# Patient Record
Sex: Male | Born: 2000 | Race: White | Hispanic: No | Marital: Single
Health system: Southern US, Community
[De-identification: ages and names within clinical notes are randomized; demographics above are authoritative.]

---
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
## 2020-08-28 ENCOUNTER — Emergency Department (HOSPITAL_COMMUNITY): Payer: Medicaid Other

## 2020-08-28 ENCOUNTER — Other Ambulatory Visit: Payer: Self-pay

## 2020-08-28 ENCOUNTER — Encounter (HOSPITAL_COMMUNITY): Payer: Self-pay

## 2020-08-28 ENCOUNTER — Inpatient Hospital Stay (HOSPITAL_COMMUNITY)
Admission: EM | Admit: 2020-08-28 | Discharge: 2020-09-06 | DRG: 982 | Disposition: A | Discharge status: Home / Self Care | Payer: Medicaid Other | Attending: Physician Assistant | Admitting: Physician Assistant

## 2020-08-28 DIAGNOSIS — S22049A Unspecified fracture of fourth thoracic vertebra, initial encounter for closed fracture: Secondary | ICD-10-CM | POA: Diagnosis present

## 2020-08-28 DIAGNOSIS — I959 Hypotension, unspecified: Secondary | ICD-10-CM | POA: Diagnosis present

## 2020-08-28 DIAGNOSIS — Z6834 Body mass index (BMI) 34.0-34.9, adult: Secondary | ICD-10-CM

## 2020-08-28 DIAGNOSIS — S0003XA Contusion of scalp, initial encounter: Secondary | ICD-10-CM | POA: Diagnosis present

## 2020-08-28 DIAGNOSIS — R935 Abnormal findings on diagnostic imaging of other abdominal regions, including retroperitoneum: Secondary | ICD-10-CM | POA: Diagnosis present

## 2020-08-28 DIAGNOSIS — T1490XA Injury, unspecified, initial encounter: Secondary | ICD-10-CM

## 2020-08-28 DIAGNOSIS — S3600XA Unspecified injury of spleen, initial encounter: Secondary | ICD-10-CM | POA: Diagnosis present

## 2020-08-28 DIAGNOSIS — D72829 Elevated white blood cell count, unspecified: Secondary | ICD-10-CM | POA: Diagnosis present

## 2020-08-28 DIAGNOSIS — S3210XA Unspecified fracture of sacrum, initial encounter for closed fracture: Secondary | ICD-10-CM

## 2020-08-28 DIAGNOSIS — Z23 Encounter for immunization: Secondary | ICD-10-CM

## 2020-08-28 DIAGNOSIS — Z20822 Contact with and (suspected) exposure to covid-19: Secondary | ICD-10-CM | POA: Diagnosis present

## 2020-08-28 DIAGNOSIS — S31010A Laceration without foreign body of lower back and pelvis without penetration into retroperitoneum, initial encounter: Secondary | ICD-10-CM | POA: Diagnosis present

## 2020-08-28 DIAGNOSIS — K5900 Constipation, unspecified: Secondary | ICD-10-CM | POA: Diagnosis not present

## 2020-08-28 DIAGNOSIS — S32131A Minimally displaced Zone III fracture of sacrum, initial encounter for closed fracture: Secondary | ICD-10-CM

## 2020-08-28 DIAGNOSIS — S20229A Contusion of unspecified back wall of thorax, initial encounter: Secondary | ICD-10-CM | POA: Diagnosis present

## 2020-08-28 DIAGNOSIS — E669 Obesity, unspecified: Secondary | ICD-10-CM | POA: Diagnosis present

## 2020-08-28 DIAGNOSIS — S30810A Abrasion of lower back and pelvis, initial encounter: Secondary | ICD-10-CM | POA: Diagnosis present

## 2020-08-28 DIAGNOSIS — S3500XA Unspecified injury of abdominal aorta, initial encounter: Secondary | ICD-10-CM

## 2020-08-28 DIAGNOSIS — R9431 Abnormal electrocardiogram [ECG] [EKG]: Secondary | ICD-10-CM | POA: Diagnosis present

## 2020-08-28 DIAGNOSIS — J9690 Respiratory failure, unspecified, unspecified whether with hypoxia or hypercapnia: Secondary | ICD-10-CM

## 2020-08-28 DIAGNOSIS — Y92410 Unspecified street and highway as the place of occurrence of the external cause: Secondary | ICD-10-CM

## 2020-08-28 DIAGNOSIS — S27322A Contusion of lung, bilateral, initial encounter: Secondary | ICD-10-CM | POA: Diagnosis present

## 2020-08-28 DIAGNOSIS — T148XXA Other injury of unspecified body region, initial encounter: Secondary | ICD-10-CM

## 2020-08-28 DIAGNOSIS — E872 Acidosis: Secondary | ICD-10-CM | POA: Diagnosis present

## 2020-08-28 DIAGNOSIS — S50311A Abrasion of right elbow, initial encounter: Secondary | ICD-10-CM | POA: Diagnosis present

## 2020-08-28 DIAGNOSIS — S22009A Unspecified fracture of unspecified thoracic vertebra, initial encounter for closed fracture: Secondary | ICD-10-CM | POA: Diagnosis present

## 2020-08-28 DIAGNOSIS — S22039A Unspecified fracture of third thoracic vertebra, initial encounter for closed fracture: Secondary | ICD-10-CM | POA: Diagnosis present

## 2020-08-28 DIAGNOSIS — S2243XA Multiple fractures of ribs, bilateral, initial encounter for closed fracture: Secondary | ICD-10-CM | POA: Diagnosis present

## 2020-08-28 DIAGNOSIS — D6959 Other secondary thrombocytopenia: Secondary | ICD-10-CM | POA: Diagnosis present

## 2020-08-28 DIAGNOSIS — S2249XA Multiple fractures of ribs, unspecified side, initial encounter for closed fracture: Secondary | ICD-10-CM

## 2020-08-28 DIAGNOSIS — J939 Pneumothorax, unspecified: Secondary | ICD-10-CM

## 2020-08-28 DIAGNOSIS — D62 Acute posthemorrhagic anemia: Secondary | ICD-10-CM | POA: Diagnosis present

## 2020-08-28 DIAGNOSIS — S7001XA Contusion of right hip, initial encounter: Secondary | ICD-10-CM | POA: Diagnosis present

## 2020-08-28 DIAGNOSIS — S300XXA Contusion of lower back and pelvis, initial encounter: Principal | ICD-10-CM | POA: Diagnosis present

## 2020-08-28 LAB — CBC
HCT: 43.2 % (ref 39.0–52.0)
Hemoglobin: 14.3 g/dL (ref 13.0–17.0)
MCH: 28.9 pg (ref 26.0–34.0)
MCHC: 33.1 g/dL (ref 30.0–36.0)
MCV: 87.3 fL (ref 80.0–100.0)
Platelets: 280 10*3/uL (ref 150–400)
RBC: 4.95 MIL/uL (ref 4.22–5.81)
RDW: 13.1 % (ref 11.5–15.5)
WBC: 22.3 10*3/uL — ABNORMAL HIGH (ref 4.0–10.5)
nRBC: 0 % (ref 0.0–0.2)

## 2020-08-28 LAB — ETHANOL: Alcohol, Ethyl (B): <10 mg/dL (ref <10)

## 2020-08-28 LAB — I-STAT CHEM 8, ED
BUN: 9 mg/dL (ref 6–20)
Calcium, Ion: 1.1 mmol/L — ABNORMAL LOW (ref 1.15–1.40)
Chloride: 108 mmol/L (ref 98–111)
Creatinine, Ser: 1.2 mg/dL (ref 0.61–1.24)
Glucose, Bld: 150 mg/dL — ABNORMAL HIGH (ref 70–99)
HCT: 41 % (ref 39.0–52.0)
Hemoglobin: 13.9 g/dL (ref 13.0–17.0)
Potassium: 3.2 mmol/L — ABNORMAL LOW (ref 3.5–5.1)
Sodium: 141 mmol/L (ref 135–145)
TCO2: 17 mmol/L — ABNORMAL LOW (ref 22–32)

## 2020-08-28 LAB — COMPREHENSIVE METABOLIC PANEL WITH GFR
ALT: 42 U/L (ref 0–44)
AST: 64 U/L — ABNORMAL HIGH (ref 15–41)
Albumin: 3.9 g/dL (ref 3.5–5.0)
Alkaline Phosphatase: 61 U/L (ref 38–126)
Anion gap: 14 (ref 5–15)
BUN: 7 mg/dL (ref 6–20)
CO2: 19 mmol/L — ABNORMAL LOW (ref 22–32)
Calcium: 9.4 mg/dL (ref 8.9–10.3)
Chloride: 107 mmol/L (ref 98–111)
Creatinine, Ser: 1.28 mg/dL — ABNORMAL HIGH (ref 0.61–1.24)
GFR calc Af Amer: >60 mL/min (ref >60)
GFR calc non Af Amer: >60 mL/min (ref >60)
Glucose, Bld: 157 mg/dL — ABNORMAL HIGH (ref 70–99)
Potassium: 3.3 mmol/L — ABNORMAL LOW (ref 3.5–5.1)
Sodium: 140 mmol/L (ref 135–145)
Total Bilirubin: 0.5 mg/dL (ref 0.3–1.2)
Total Protein: 6.4 g/dL — ABNORMAL LOW (ref 6.5–8.1)

## 2020-08-28 LAB — SAMPLE TO BLOOD BANK

## 2020-08-28 LAB — SARS CORONAVIRUS 2 BY RT PCR (HOSPITAL ORDER, PERFORMED IN ~~LOC~~ HOSPITAL LAB): SARS Coronavirus 2: NEGATIVE

## 2020-08-28 LAB — PROTIME-INR
INR: 0.9 (ref 0.8–1.2)
Prothrombin Time: 11.7 s (ref 11.4–15.2)

## 2020-08-28 LAB — LACTIC ACID, PLASMA: Lactic Acid, Venous: 4.6 mmol/L (ref 0.5–1.9)

## 2020-08-28 MED ORDER — IOHEXOL 300 MG/ML  SOLN
100.0000 mL | Freq: Once | INTRAMUSCULAR | Status: AC | PRN
  Administered 2020-08-28: 100 mL via INTRAVENOUS

## 2020-08-28 MED ORDER — FENTANYL CITRATE (PF) 100 MCG/2ML IJ SOLN
100.0000 ug | Freq: Once | INTRAMUSCULAR | Status: AC
  Administered 2020-08-28: 100 ug via INTRAVENOUS

## 2020-08-28 MED ORDER — FENTANYL CITRATE (PF) 100 MCG/2ML IJ SOLN
INTRAMUSCULAR | Status: AC
  Filled 2020-08-28: qty 2

## 2020-08-28 MED ORDER — CEFAZOLIN SODIUM-DEXTROSE 2-4 GM/100ML-% IV SOLN
2.0000 g | INTRAVENOUS | Status: AC
  Administered 2020-08-29: 2 g via INTRAVENOUS
  Filled 2020-08-28: qty 100

## 2020-08-28 MED ORDER — TETANUS-DIPHTH-ACELL PERTUSSIS 5-2.5-18.5 LF-MCG/0.5 IM SUSP
0.5000 mL | Freq: Once | INTRAMUSCULAR | Status: AC
  Administered 2020-08-28: 0.5 mL via INTRAMUSCULAR
  Filled 2020-08-28: qty 0.5

## 2020-08-28 MED ORDER — LACTATED RINGERS IV BOLUS
1000.0000 mL | Freq: Once | INTRAVENOUS | Status: AC
  Administered 2020-08-28: 1000 mL via INTRAVENOUS
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
## 2020-08-28 NOTE — ED Notes (Signed)
Pelvic binder applied 
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
## 2020-08-29 ENCOUNTER — Inpatient Hospital Stay (HOSPITAL_COMMUNITY): Payer: Medicaid Other

## 2020-08-29 ENCOUNTER — Encounter (HOSPITAL_COMMUNITY): Payer: Self-pay

## 2020-08-29 DIAGNOSIS — S27322A Contusion of lung, bilateral, initial encounter: Secondary | ICD-10-CM | POA: Diagnosis not present

## 2020-08-29 DIAGNOSIS — E872 Acidosis: Secondary | ICD-10-CM | POA: Diagnosis not present

## 2020-08-29 DIAGNOSIS — S20229A Contusion of unspecified back wall of thorax, initial encounter: Secondary | ICD-10-CM | POA: Diagnosis present

## 2020-08-29 DIAGNOSIS — D6959 Other secondary thrombocytopenia: Secondary | ICD-10-CM | POA: Diagnosis present

## 2020-08-29 DIAGNOSIS — R9431 Abnormal electrocardiogram [ECG] [EKG]: Secondary | ICD-10-CM | POA: Diagnosis present

## 2020-08-29 DIAGNOSIS — S30810A Abrasion of lower back and pelvis, initial encounter: Secondary | ICD-10-CM | POA: Diagnosis present

## 2020-08-29 DIAGNOSIS — D62 Acute posthemorrhagic anemia: Secondary | ICD-10-CM | POA: Diagnosis not present

## 2020-08-29 DIAGNOSIS — S3509XA Other injury of abdominal aorta, initial encounter: Secondary | ICD-10-CM

## 2020-08-29 DIAGNOSIS — S3600XA Unspecified injury of spleen, initial encounter: Secondary | ICD-10-CM | POA: Diagnosis not present

## 2020-08-29 DIAGNOSIS — S22039A Unspecified fracture of third thoracic vertebra, initial encounter for closed fracture: Secondary | ICD-10-CM | POA: Diagnosis present

## 2020-08-29 DIAGNOSIS — E669 Obesity, unspecified: Secondary | ICD-10-CM | POA: Diagnosis present

## 2020-08-29 DIAGNOSIS — S2243XA Multiple fractures of ribs, bilateral, initial encounter for closed fracture: Secondary | ICD-10-CM | POA: Diagnosis not present

## 2020-08-29 DIAGNOSIS — Z6834 Body mass index (BMI) 34.0-34.9, adult: Secondary | ICD-10-CM | POA: Diagnosis not present

## 2020-08-29 DIAGNOSIS — S22009A Unspecified fracture of unspecified thoracic vertebra, initial encounter for closed fracture: Secondary | ICD-10-CM | POA: Diagnosis present

## 2020-08-29 DIAGNOSIS — S32131A Minimally displaced Zone III fracture of sacrum, initial encounter for closed fracture: Secondary | ICD-10-CM | POA: Diagnosis not present

## 2020-08-29 DIAGNOSIS — S31010A Laceration without foreign body of lower back and pelvis without penetration into retroperitoneum, initial encounter: Secondary | ICD-10-CM | POA: Diagnosis not present

## 2020-08-29 DIAGNOSIS — Z20822 Contact with and (suspected) exposure to covid-19: Secondary | ICD-10-CM | POA: Diagnosis not present

## 2020-08-29 DIAGNOSIS — I959 Hypotension, unspecified: Secondary | ICD-10-CM | POA: Diagnosis present

## 2020-08-29 DIAGNOSIS — D72829 Elevated white blood cell count, unspecified: Secondary | ICD-10-CM | POA: Diagnosis present

## 2020-08-29 DIAGNOSIS — K5900 Constipation, unspecified: Secondary | ICD-10-CM | POA: Diagnosis not present

## 2020-08-29 DIAGNOSIS — S50311A Abrasion of right elbow, initial encounter: Secondary | ICD-10-CM | POA: Diagnosis present

## 2020-08-29 DIAGNOSIS — S300XXA Contusion of lower back and pelvis, initial encounter: Secondary | ICD-10-CM | POA: Diagnosis present

## 2020-08-29 DIAGNOSIS — Y92410 Unspecified street and highway as the place of occurrence of the external cause: Secondary | ICD-10-CM | POA: Diagnosis not present

## 2020-08-29 DIAGNOSIS — S7001XA Contusion of right hip, initial encounter: Secondary | ICD-10-CM | POA: Diagnosis present

## 2020-08-29 DIAGNOSIS — S0003XA Contusion of scalp, initial encounter: Secondary | ICD-10-CM | POA: Diagnosis present

## 2020-08-29 DIAGNOSIS — Z23 Encounter for immunization: Secondary | ICD-10-CM | POA: Diagnosis not present

## 2020-08-29 DIAGNOSIS — S22049A Unspecified fracture of fourth thoracic vertebra, initial encounter for closed fracture: Secondary | ICD-10-CM | POA: Diagnosis not present

## 2020-08-29 LAB — MRSA PCR SCREENING: MRSA by PCR: NEGATIVE

## 2020-08-29 MED ORDER — ACETAMINOPHEN 500 MG PO TABS
1000.0000 mg | ORAL_TABLET | Freq: Four times a day (QID) | ORAL | Status: DC
  Administered 2020-08-29 – 2020-09-06 (×30): 1000 mg via ORAL
  Filled 2020-08-29 (×30): qty 2

## 2020-08-29 MED ORDER — HYDROMORPHONE HCL 1 MG/ML IJ SOLN
1.0000 mg | INTRAMUSCULAR | Status: DC | PRN
  Administered 2020-08-29: 1 mg via INTRAVENOUS
  Filled 2020-08-29: qty 1

## 2020-08-29 MED ORDER — ENOXAPARIN SODIUM 40 MG/0.4ML ~~LOC~~ SOLN
40.0000 mg | Freq: Two times a day (BID) | SUBCUTANEOUS | Status: DC
  Administered 2020-08-29 (×2): 40 mg via SUBCUTANEOUS
  Filled 2020-08-29 (×2): qty 0.4

## 2020-08-29 MED ORDER — ENOXAPARIN SODIUM 40 MG/0.4ML ~~LOC~~ SOLN: 40.0000 mg | SUBCUTANEOUS | Status: DC

## 2020-08-29 MED ORDER — POTASSIUM CHLORIDE IN NACL 20-0.9 MEQ/L-% IV SOLN
INTRAVENOUS | Status: DC
  Filled 2020-08-29: qty 1000

## 2020-08-29 MED ORDER — LACTATED RINGERS IV SOLN: INTRAVENOUS | Status: DC

## 2020-08-29 MED ORDER — POTASSIUM CHLORIDE 20 MEQ/15ML (10%) PO SOLN
40.0000 meq | ORAL | Status: AC
  Administered 2020-08-29: 40 meq via ORAL
  Filled 2020-08-29: qty 30

## 2020-08-29 MED ORDER — HYDROMORPHONE HCL 1 MG/ML IJ SOLN
2.0000 mg | Freq: Once | INTRAMUSCULAR | Status: AC
  Administered 2020-08-29: 2 mg via INTRAVENOUS
  Filled 2020-08-29: qty 2

## 2020-08-29 MED ORDER — METHOCARBAMOL 500 MG PO TABS
1000.0000 mg | ORAL_TABLET | Freq: Three times a day (TID) | ORAL | Status: DC
  Administered 2020-08-30 – 2020-09-06 (×22): 1000 mg via ORAL
  Filled 2020-08-29 (×22): qty 2

## 2020-08-29 MED ORDER — HYDROMORPHONE HCL 1 MG/ML IJ SOLN
0.5000 mg | INTRAMUSCULAR | Status: DC | PRN
  Administered 2020-08-29 – 2020-08-31 (×4): 0.5 mg via INTRAVENOUS
  Filled 2020-08-29 (×4): qty 1

## 2020-08-29 MED ORDER — ONDANSETRON HCL 4 MG/2ML IJ SOLN
4.0000 mg | Freq: Four times a day (QID) | INTRAMUSCULAR | Status: DC | PRN
  Administered 2020-08-29 – 2020-09-01 (×2): 4 mg via INTRAVENOUS
  Filled 2020-08-29 (×2): qty 2

## 2020-08-29 MED ORDER — LACTATED RINGERS IV BOLUS
1000.0000 mL | Freq: Once | INTRAVENOUS | Status: AC
  Administered 2020-08-29: 1000 mL via INTRAVENOUS

## 2020-08-29 MED ORDER — ONDANSETRON HCL 4 MG/2ML IJ SOLN
4.0000 mg | Freq: Once | INTRAMUSCULAR | Status: AC
  Administered 2020-08-29: 4 mg via INTRAVENOUS
  Filled 2020-08-29: qty 2

## 2020-08-29 MED ORDER — OXYCODONE HCL 5 MG/5ML PO SOLN: 5.0000 mg | ORAL | Status: DC | PRN

## 2020-08-29 MED ORDER — METOCLOPRAMIDE HCL 5 MG/ML IJ SOLN
10.0000 mg | INTRAMUSCULAR | Status: DC
  Filled 2020-08-29: qty 2

## 2020-08-29 MED ORDER — ONDANSETRON 4 MG PO TBDP: 4.0000 mg | ORAL_TABLET | Freq: Four times a day (QID) | ORAL | Status: DC | PRN

## 2020-08-29 MED ORDER — CHLORHEXIDINE GLUCONATE CLOTH 2 % EX PADS
6.0000 | MEDICATED_PAD | Freq: Every day | CUTANEOUS | Status: DC
  Administered 2020-08-29 – 2020-09-01 (×3): 6 via TOPICAL

## 2020-08-29 MED ORDER — PROMETHAZINE HCL 25 MG/ML IJ SOLN
25.0000 mg | Freq: Four times a day (QID) | INTRAMUSCULAR | Status: DC | PRN
  Administered 2020-08-29 – 2020-08-30 (×4): 25 mg via INTRAVENOUS
  Filled 2020-08-29 (×4): qty 1
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
## 2020-08-29 NOTE — Progress Notes (Signed)
Patient received on unit; while turning patient to right side to change sheets copious sanguineous drainage was observed to pour out of a small wound in patient's right lower back. Soft tissue was also seen coming out of the wound. Patient was in great pain when turned to the right. RN administered pain medicine, held pressure and applied pressure dressing. Physician notified, patient stable at this time. Will continue to monitor
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
## 2020-08-29 NOTE — Progress Notes (Signed)
CT c-spine reviewed and negative for fracture. Clinical exam performed to evaluate for ligamentous injury. C-spine evaluation performed. No midline pain or TTP. Patient able to turn head left and right without midline cervical pain. Neck flexion and extension performed without midline pain. C-spine cleared and collar removed.   Kevin Palleschi N. Valentine Barney, MD General and Trauma Surgery [facility]   
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
## 2020-08-30 ENCOUNTER — Inpatient Hospital Stay (HOSPITAL_COMMUNITY): Payer: Medicaid Other

## 2020-08-30 LAB — CBC
HCT: 27.2 % — ABNORMAL LOW (ref 39.0–52.0)
HCT: 26.2 % — ABNORMAL LOW (ref 39.0–52.0)
HCT: 25.2 % — ABNORMAL LOW (ref 39.0–52.0)
Hemoglobin: 9.1 g/dL — ABNORMAL LOW (ref 13.0–17.0)
Hemoglobin: 8.6 g/dL — ABNORMAL LOW (ref 13.0–17.0)
Hemoglobin: 8.5 g/dL — ABNORMAL LOW (ref 13.0–17.0)
MCH: 28.7 pg (ref 26.0–34.0)
MCH: 28 pg (ref 26.0–34.0)
MCH: 28.7 pg (ref 26.0–34.0)
MCHC: 33.5 g/dL (ref 30.0–36.0)
MCHC: 32.8 g/dL (ref 30.0–36.0)
MCHC: 33.7 g/dL (ref 30.0–36.0)
MCV: 85.8 fL (ref 80.0–100.0)
MCV: 85.3 fL (ref 80.0–100.0)
MCV: 85.1 fL (ref 80.0–100.0)
Platelets: 58 10*3/uL — ABNORMAL LOW (ref 150–400)
Platelets: 51 10*3/uL — ABNORMAL LOW (ref 150–400)
Platelets: 52 10*3/uL — ABNORMAL LOW (ref 150–400)
RBC: 3.17 MIL/uL — ABNORMAL LOW (ref 4.22–5.81)
RBC: 3.07 MIL/uL — ABNORMAL LOW (ref 4.22–5.81)
RBC: 2.96 MIL/uL — ABNORMAL LOW (ref 4.22–5.81)
RDW: 13.4 % (ref 11.5–15.5)
RDW: 13.4 % (ref 11.5–15.5)
RDW: 13.3 % (ref 11.5–15.5)
WBC: 7.2 10*3/uL (ref 4.0–10.5)
WBC: 7 10*3/uL (ref 4.0–10.5)
WBC: 7.7 10*3/uL (ref 4.0–10.5)
nRBC: 0 % (ref 0.0–0.2)
nRBC: 0 % (ref 0.0–0.2)
nRBC: 0 % (ref 0.0–0.2)

## 2020-08-30 LAB — BASIC METABOLIC PANEL WITH GFR
Anion gap: 8 (ref 5–15)
BUN: 11 mg/dL (ref 6–20)
CO2: 24 mmol/L (ref 22–32)
Calcium: 8.4 mg/dL — ABNORMAL LOW (ref 8.9–10.3)
Chloride: 105 mmol/L (ref 98–111)
Creatinine, Ser: 0.99 mg/dL (ref 0.61–1.24)
GFR calc Af Amer: >60 mL/min (ref >60)
GFR calc non Af Amer: >60 mL/min (ref >60)
Glucose, Bld: 105 mg/dL — ABNORMAL HIGH (ref 70–99)
Potassium: 3.9 mmol/L (ref 3.5–5.1)
Sodium: 137 mmol/L (ref 135–145)

## 2020-08-30 LAB — HIV ANTIBODY (ROUTINE TESTING W REFLEX): HIV Screen 4th Generation wRfx: NONREACTIVE

## 2020-08-30 MED ORDER — BACITRACIN ZINC 500 UNIT/GM EX OINT
TOPICAL_OINTMENT | Freq: Two times a day (BID) | CUTANEOUS | Status: DC
  Administered 2020-09-01 – 2020-09-06 (×3): 1 via TOPICAL
  Filled 2020-08-30: qty 28.4

## 2020-08-30 MED ORDER — IOHEXOL 350 MG/ML SOLN
100.0000 mL | Freq: Once | INTRAVENOUS | Status: AC | PRN
  Administered 2020-08-30: 100 mL via INTRAVENOUS
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
## 2020-08-30 NOTE — Progress Notes (Addendum)
Patient ID: Kevin Galvan, male   DOB: 20-Jun-2001, 19 y.o.   MRN: [id number] Follow-up CT angio chest abdomen pelvis preliminarily reviewed with radiologist.  The aortic injury looks resolved and nonconcerning.  Significant hematoma of right buttock area extending from sacral fracture without active extravasation.  His soft tissue William Hamburger  type injury of the back is extensive and has significantly more air within it today.  This extends from his back including a small area of his retroperitoneum around and down into a portion of his scrotum and up around his ribs on the right side.  I do not see a significant right-sided pneumothorax.  No sign of bowel perforation. His hemoglobin has stabilized and his white blood cell count is normal.  He is afebrile.  On examination he has no significant tenderness in his scrotal area or perineal area.  No evidence of infection there.  His back has a large abrasion and hematoma more focused on the right buttock area and extending up his back.  There is a small open wound that is draining some serosanguineous fluid.  No cellulitis, no erythema.  Plan for further review by the trauma radiologist.  I discussed the plan in detail with the patient.  Violeta Gelinas, MD, MPH, FACS Please use [email or web address] to contact on call provider
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
## 2020-08-31 LAB — CBC
HCT: 22.3 % — ABNORMAL LOW (ref 39.0–52.0)
Hemoglobin: 7.3 g/dL — ABNORMAL LOW (ref 13.0–17.0)
MCH: 27.9 pg (ref 26.0–34.0)
MCHC: 32.7 g/dL (ref 30.0–36.0)
MCV: 85.1 fL (ref 80.0–100.0)
Platelets: 53 10*3/uL — ABNORMAL LOW (ref 150–400)
RBC: 2.62 MIL/uL — ABNORMAL LOW (ref 4.22–5.81)
RDW: 13.3 % (ref 11.5–15.5)
WBC: 6.7 10*3/uL (ref 4.0–10.5)
nRBC: 0 % (ref 0.0–0.2)

## 2020-08-31 LAB — BASIC METABOLIC PANEL WITH GFR
Anion gap: 8 (ref 5–15)
BUN: 10 mg/dL (ref 6–20)
CO2: 24 mmol/L (ref 22–32)
Calcium: 8.4 mg/dL — ABNORMAL LOW (ref 8.9–10.3)
Chloride: 105 mmol/L (ref 98–111)
Creatinine, Ser: 0.83 mg/dL (ref 0.61–1.24)
GFR calc Af Amer: >60 mL/min (ref >60)
GFR calc non Af Amer: >60 mL/min (ref >60)
Glucose, Bld: 96 mg/dL (ref 70–99)
Potassium: 3.8 mmol/L (ref 3.5–5.1)
Sodium: 137 mmol/L (ref 135–145)

## 2020-08-31 MED ORDER — GABAPENTIN 300 MG PO CAPS
300.0000 mg | ORAL_CAPSULE | Freq: Three times a day (TID) | ORAL | Status: DC
  Administered 2020-08-31 – 2020-09-06 (×19): 300 mg via ORAL
  Filled 2020-08-31 (×19): qty 1

## 2020-08-31 MED ORDER — OXYCODONE HCL 5 MG/5ML PO SOLN
5.0000 mg | ORAL | Status: DC | PRN
  Administered 2020-08-31 – 2020-09-05 (×6): 10 mg via ORAL
  Filled 2020-08-31 (×6): qty 10

## 2020-08-31 MED ORDER — CEFAZOLIN SODIUM-DEXTROSE 2-4 GM/100ML-% IV SOLN
2.0000 g | INTRAVENOUS | Status: AC
  Administered 2020-09-01: 2 g via INTRAVENOUS
  Filled 2020-08-31: qty 100

## 2020-08-31 MED ORDER — ENSURE PRE-SURGERY PO LIQD
296.0000 mL | Freq: Once | ORAL | Status: AC
  Administered 2020-08-31: 296 mL via ORAL
  Filled 2020-08-31: qty 296

## 2020-08-31 MED ORDER — POVIDONE-IODINE 10 % EX SWAB: 2.0000 "application " | Freq: Once | CUTANEOUS | Status: DC

## 2020-08-31 MED ORDER — CHLORHEXIDINE GLUCONATE 4 % EX LIQD
60.0000 mL | Freq: Once | CUTANEOUS | Status: AC
  Administered 2020-08-31: 4 via TOPICAL
  Filled 2020-08-31: qty 15

## 2020-08-31 MED ORDER — HYDROMORPHONE HCL 1 MG/ML IJ SOLN: 0.5000 mg | INTRAMUSCULAR | Status: DC | PRN
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
## 2020-08-31 NOTE — Consult Note (Signed)
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 patient's extensive soft tissue injuries. Explained the concern with this type of injury is possibility of infection in the future. If we were to place drain(s) for these lesions, patient is looking at a minimum of 4-6 drains (again as his injury is extensive)- it is unlikely that drains will help this condition. Per Dr. Loreta Ave, does not recommend drain placement(s) and recommends surgical intervention/washout as management. Patient appears to be leaning towards surgery but asks that he speak with his parents and orthopedics again before making a final decision. Dr. Loreta Ave to  discuss above with Charma Igo, PA-C via telephone. No IR interventions planned at this time- will delete order. Please call IR with questions/concerns.   Thank you for this interesting consult.  I greatly enjoyed meeting Kevin Galvan and look forward to participating in their care.  A copy of this report was sent to the requesting provider on this date.  Electronically Signed: Elwin Mocha, PA-C 08/31/2020, 2:37 PM   I spent a total of 20 Minutes in face to face in clinical consultation, greater than 50% of which was counseling/coordinating care for lumbosacral Morel-Lavallee lesions.
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
## 2020-09-01 ENCOUNTER — Inpatient Hospital Stay (HOSPITAL_COMMUNITY): Payer: Medicaid Other | Admitting: Anesthesiology

## 2020-09-01 ENCOUNTER — Encounter (HOSPITAL_COMMUNITY): Admission: EM | Disposition: A | Discharge status: Home / Self Care | Payer: Self-pay | Source: Home / Self Care

## 2020-09-01 ENCOUNTER — Encounter (HOSPITAL_COMMUNITY): Payer: Self-pay

## 2020-09-01 DIAGNOSIS — S2249XA Multiple fractures of ribs, unspecified side, initial encounter for closed fracture: Secondary | ICD-10-CM

## 2020-09-01 DIAGNOSIS — S3210XA Unspecified fracture of sacrum, initial encounter for closed fracture: Secondary | ICD-10-CM

## 2020-09-01 DIAGNOSIS — T148XXA Other injury of unspecified body region, initial encounter: Secondary | ICD-10-CM

## 2020-09-01 HISTORY — PX: INCISION AND DRAINAGE HIP: SHX1801

## 2020-09-01 LAB — BASIC METABOLIC PANEL WITH GFR
Anion gap: 10 (ref 5–15)
BUN: 10 mg/dL (ref 6–20)
CO2: 24 mmol/L (ref 22–32)
Calcium: 8.5 mg/dL — ABNORMAL LOW (ref 8.9–10.3)
Chloride: 105 mmol/L (ref 98–111)
Creatinine, Ser: 0.88 mg/dL (ref 0.61–1.24)
GFR calc Af Amer: >60 mL/min (ref >60)
GFR calc non Af Amer: >60 mL/min (ref >60)
Glucose, Bld: 100 mg/dL — ABNORMAL HIGH (ref 70–99)
Potassium: 4 mmol/L (ref 3.5–5.1)
Sodium: 139 mmol/L (ref 135–145)

## 2020-09-01 LAB — CBC
HCT: 23.2 % — ABNORMAL LOW (ref 39.0–52.0)
Hemoglobin: 7.8 g/dL — ABNORMAL LOW (ref 13.0–17.0)
MCH: 29.1 pg (ref 26.0–34.0)
MCHC: 33.6 g/dL (ref 30.0–36.0)
MCV: 86.6 fL (ref 80.0–100.0)
Platelets: 97 10*3/uL — ABNORMAL LOW (ref 150–400)
RBC: 2.68 MIL/uL — ABNORMAL LOW (ref 4.22–5.81)
RDW: 13.8 % (ref 11.5–15.5)
WBC: 8.6 10*3/uL (ref 4.0–10.5)
nRBC: 0.3 % — ABNORMAL HIGH (ref 0.0–0.2)

## 2020-09-01 LAB — ABO/RH: ABO/RH(D): A POS

## 2020-09-01 SURGERY — IRRIGATION AND DEBRIDEMENT HIP
Anesthesia: General | Site: Hip | Laterality: Bilateral

## 2020-09-01 MED ORDER — FENTANYL CITRATE (PF) 100 MCG/2ML IJ SOLN
INTRAMUSCULAR | Status: DC | PRN
Start: 2020-09-01 — End: 2020-09-01
  Administered 2020-09-01 (×4): 50 ug via INTRAVENOUS

## 2020-09-01 MED ORDER — ACETAMINOPHEN 160 MG/5ML PO SOLN: 1000.0000 mg | Freq: Once | ORAL | Status: DC | PRN

## 2020-09-01 MED ORDER — CEFAZOLIN SODIUM-DEXTROSE 2-4 GM/100ML-% IV SOLN
2.0000 g | Freq: Three times a day (TID) | INTRAVENOUS | Status: AC
  Administered 2020-09-01 – 2020-09-02 (×3): 2 g via INTRAVENOUS
  Filled 2020-09-01 (×3): qty 100

## 2020-09-01 MED ORDER — PHENYLEPHRINE 40 MCG/ML (10ML) SYRINGE FOR IV PUSH (FOR BLOOD PRESSURE SUPPORT)
PREFILLED_SYRINGE | INTRAVENOUS | Status: DC | PRN
  Administered 2020-09-01 (×5): 80 ug via INTRAVENOUS

## 2020-09-01 MED ORDER — FENTANYL CITRATE (PF) 100 MCG/2ML IJ SOLN
25.0000 ug | INTRAMUSCULAR | Status: DC | PRN
  Administered 2020-09-01: 50 ug via INTRAVENOUS

## 2020-09-01 MED ORDER — ALBUMIN HUMAN 5 % IV SOLN: INTRAVENOUS | Status: DC | PRN

## 2020-09-01 MED ORDER — DOCUSATE SODIUM 100 MG PO CAPS
100.0000 mg | ORAL_CAPSULE | Freq: Two times a day (BID) | ORAL | Status: DC
  Administered 2020-09-01 – 2020-09-05 (×7): 100 mg via ORAL
  Filled 2020-09-01 (×7): qty 1

## 2020-09-01 MED ORDER — OXYCODONE HCL 5 MG/5ML PO SOLN: 5.0000 mg | Freq: Once | ORAL | Status: DC | PRN

## 2020-09-01 MED ORDER — DEXAMETHASONE SODIUM PHOSPHATE 10 MG/ML IJ SOLN
INTRAMUSCULAR | Status: AC
  Filled 2020-09-01: qty 1

## 2020-09-01 MED ORDER — LACTATED RINGERS IV SOLN: INTRAVENOUS | Status: DC

## 2020-09-01 MED ORDER — CHLORHEXIDINE GLUCONATE 0.12 % MT SOLN
OROMUCOSAL | Status: AC
  Administered 2020-09-01: 15 mL via OROMUCOSAL
  Filled 2020-09-01: qty 15

## 2020-09-01 MED ORDER — ROCURONIUM BROMIDE 10 MG/ML (PF) SYRINGE
PREFILLED_SYRINGE | INTRAVENOUS | Status: AC
  Filled 2020-09-01: qty 10

## 2020-09-01 MED ORDER — FENTANYL CITRATE (PF) 100 MCG/2ML IJ SOLN
INTRAMUSCULAR | Status: AC
  Administered 2020-09-01: 50 ug via INTRAVENOUS
  Filled 2020-09-01: qty 2

## 2020-09-01 MED ORDER — DEXAMETHASONE SODIUM PHOSPHATE 10 MG/ML IJ SOLN
INTRAMUSCULAR | Status: DC | PRN
  Administered 2020-09-01: 10 mg via INTRAVENOUS

## 2020-09-01 MED ORDER — MIDAZOLAM HCL 5 MG/5ML IJ SOLN
INTRAMUSCULAR | Status: DC | PRN
  Administered 2020-09-01: 2 mg via INTRAVENOUS

## 2020-09-01 MED ORDER — ACETAMINOPHEN 10 MG/ML IV SOLN
1000.0000 mg | Freq: Once | INTRAVENOUS | Status: DC | PRN
  Administered 2020-09-01: 1000 mg via INTRAVENOUS

## 2020-09-01 MED ORDER — MIDAZOLAM HCL 2 MG/2ML IJ SOLN
INTRAMUSCULAR | Status: AC
  Filled 2020-09-01: qty 2

## 2020-09-01 MED ORDER — LIDOCAINE 2% (20 MG/ML) 5 ML SYRINGE
INTRAMUSCULAR | Status: AC
  Filled 2020-09-01: qty 5

## 2020-09-01 MED ORDER — TOBRAMYCIN SULFATE 1.2 G IJ SOLR
INTRAMUSCULAR | Status: AC
  Filled 2020-09-01: qty 1.2

## 2020-09-01 MED ORDER — PROPOFOL 10 MG/ML IV BOLUS
INTRAVENOUS | Status: DC | PRN
  Administered 2020-09-01: 200 mg via INTRAVENOUS

## 2020-09-01 MED ORDER — 0.9 % SODIUM CHLORIDE (POUR BTL) OPTIME
TOPICAL | Status: DC | PRN
  Administered 2020-09-01: 1000 mL

## 2020-09-01 MED ORDER — SODIUM CHLORIDE 0.9 % IR SOLN
Status: DC | PRN
  Administered 2020-09-01: 3000 mL

## 2020-09-01 MED ORDER — ACETAMINOPHEN 500 MG PO TABS: 1000.0000 mg | ORAL_TABLET | Freq: Once | ORAL | Status: DC | PRN

## 2020-09-01 MED ORDER — ROCURONIUM BROMIDE 10 MG/ML (PF) SYRINGE
PREFILLED_SYRINGE | INTRAVENOUS | Status: DC | PRN
  Administered 2020-09-01: 60 mg via INTRAVENOUS

## 2020-09-01 MED ORDER — CHLORHEXIDINE GLUCONATE 0.12 % MT SOLN
15.0000 mL | OROMUCOSAL | Status: AC
  Filled 2020-09-01: qty 15

## 2020-09-01 MED ORDER — ONDANSETRON HCL 4 MG/2ML IJ SOLN
INTRAMUSCULAR | Status: AC
  Filled 2020-09-01: qty 2

## 2020-09-01 MED ORDER — VANCOMYCIN HCL 1000 MG IV SOLR
INTRAVENOUS | Status: DC | PRN
  Administered 2020-09-01: 1000 mg

## 2020-09-01 MED ORDER — PROPOFOL 10 MG/ML IV BOLUS
INTRAVENOUS | Status: AC
  Filled 2020-09-01: qty 20

## 2020-09-01 MED ORDER — ENOXAPARIN SODIUM 40 MG/0.4ML ~~LOC~~ SOLN: 40.0000 mg | SUBCUTANEOUS | Status: DC

## 2020-09-01 MED ORDER — LIDOCAINE 2% (20 MG/ML) 5 ML SYRINGE
INTRAMUSCULAR | Status: DC | PRN
  Administered 2020-09-01: 40 mg via INTRAVENOUS

## 2020-09-01 MED ORDER — FENTANYL CITRATE (PF) 250 MCG/5ML IJ SOLN
INTRAMUSCULAR | Status: AC
  Filled 2020-09-01: qty 5

## 2020-09-01 MED ORDER — PHENYLEPHRINE 40 MCG/ML (10ML) SYRINGE FOR IV PUSH (FOR BLOOD PRESSURE SUPPORT)
PREFILLED_SYRINGE | INTRAVENOUS | Status: AC
  Filled 2020-09-01: qty 10

## 2020-09-01 MED ORDER — TOBRAMYCIN SULFATE 1.2 G IJ SOLR
INTRAMUSCULAR | Status: DC | PRN
  Administered 2020-09-01: 1.2 g

## 2020-09-01 MED ORDER — OXYCODONE HCL 5 MG PO TABS: 5.0000 mg | ORAL_TABLET | Freq: Once | ORAL | Status: DC | PRN

## 2020-09-01 MED ORDER — SUGAMMADEX SODIUM 200 MG/2ML IV SOLN
INTRAVENOUS | Status: DC | PRN
  Administered 2020-09-01: 250 mg via INTRAVENOUS

## 2020-09-01 MED ORDER — POLYETHYLENE GLYCOL 3350 17 G PO PACK
17.0000 g | PACK | Freq: Every day | ORAL | Status: DC
  Administered 2020-09-02 – 2020-09-05 (×3): 17 g via ORAL
  Filled 2020-09-01 (×3): qty 1

## 2020-09-01 MED ORDER — ACETAMINOPHEN 10 MG/ML IV SOLN
INTRAVENOUS | Status: AC
  Filled 2020-09-01: qty 100

## 2020-09-01 MED ORDER — VANCOMYCIN HCL 1000 MG IV SOLR
INTRAVENOUS | Status: AC
  Filled 2020-09-01: qty 1000

## 2020-09-01 SURGICAL SUPPLY — 53 items
BNDG COHESIVE 4X5 TAN STRL (GAUZE/BANDAGES/DRESSINGS) ×3 IMPLANT
BNDG GAUZE ELAST 4 BULKY (GAUZE/BANDAGES/DRESSINGS) ×6 IMPLANT
BRUSH SCRUB EZ PLAIN DRY (MISCELLANEOUS) ×6 IMPLANT
CHLORAPREP W/TINT 26 (MISCELLANEOUS) ×6 IMPLANT
COVER MAYO STAND STRL (DRAPES) ×3 IMPLANT
COVER SURGICAL LIGHT HANDLE (MISCELLANEOUS) ×6 IMPLANT
COVER WAND RF STERILE (DRAPES) ×3 IMPLANT
DERMABOND ADHESIVE PROPEN (GAUZE/BANDAGES/DRESSINGS) ×4
DERMABOND ADVANCED .7 DNX6 (GAUZE/BANDAGES/DRESSINGS) ×2 IMPLANT
DRAPE ORTHO SPLIT 77X108 STRL (DRAPES) ×8
DRAPE SURG 17X23 STRL (DRAPES) ×3 IMPLANT
DRAPE SURG ORHT 6 SPLT 77X108 (DRAPES) ×4 IMPLANT
DRAPE U-SHAPE 47X51 STRL (DRAPES) ×6 IMPLANT
DRSG ADAPTIC 3X8 NADH LF (GAUZE/BANDAGES/DRESSINGS) ×3 IMPLANT
DRSG TEGADERM 4X10 (GAUZE/BANDAGES/DRESSINGS) ×3 IMPLANT
ELECT REM PT RETURN 9FT ADLT (ELECTROSURGICAL) ×3 IMPLANT
ELECTRODE REM PT RTRN 9FT ADLT (ELECTROSURGICAL) ×1 IMPLANT
EVACUATOR 1/8 PVC DRAIN (DRAIN) ×3 IMPLANT
GAUZE SPONGE 4X4 12PLY STRL (GAUZE/BANDAGES/DRESSINGS) ×3 IMPLANT
GLOVE BIO SURGEON STRL SZ 6.5 (GLOVE) ×6 IMPLANT
GLOVE BIO SURGEON STRL SZ7.5 (GLOVE) ×12 IMPLANT
GLOVE BIO SURGEONS STRL SZ 6.5 (GLOVE) ×3
GLOVE BIOGEL PI IND STRL 6.5 (GLOVE) ×1 IMPLANT
GLOVE BIOGEL PI IND STRL 7.5 (GLOVE) ×1 IMPLANT
GLOVE BIOGEL PI INDICATOR 6.5 (GLOVE) ×2
GLOVE BIOGEL PI INDICATOR 7.5 (GLOVE) ×2
GOWN STRL REUS W/ TWL LRG LVL3 (GOWN DISPOSABLE) ×2 IMPLANT
GOWN STRL REUS W/TWL LRG LVL3 (GOWN DISPOSABLE) ×4
HANDLE SUCTION POOLE (INSTRUMENTS) ×1 IMPLANT
KIT BASIN OR (CUSTOM PROCEDURE TRAY) ×3 IMPLANT
KIT TURNOVER KIT B (KITS) ×3 IMPLANT
MANIFOLD NEPTUNE II (INSTRUMENTS) ×3 IMPLANT
NS IRRIG 1000ML POUR BTL (IV SOLUTION) ×3 IMPLANT
PACK ORTHO EXTREMITY (CUSTOM PROCEDURE TRAY) ×3 IMPLANT
PAD ARMBOARD 7.5X6 YLW CONV (MISCELLANEOUS) ×6 IMPLANT
PADDING CAST COTTON 6X4 STRL (CAST SUPPLIES) ×3 IMPLANT
SET IRRIG Y TYPE TUR BLADDER L (SET/KITS/TRAYS/PACK) ×3 IMPLANT
SPONGE LAP 18X18 RF (DISPOSABLE) ×3 IMPLANT
SUCTION POOLE HANDLE (INSTRUMENTS) ×3 IMPLANT
SUT ETHILON 2 0 FS 18 (SUTURE) ×9 IMPLANT
SUT ETHILON 3 0 PS 1 (SUTURE) ×6 IMPLANT
SUT MON AB 2-0 CT1 36 (SUTURE) ×3 IMPLANT
SUT PDS AB 0 CT 36 (SUTURE) ×3 IMPLANT
SUT SILK 0 SH 30 (SUTURE) ×3 IMPLANT
SUT VIC AB 2-0 CT1 27 (SUTURE) ×2
SUT VIC AB 2-0 CT1 TAPERPNT 27 (SUTURE) ×1 IMPLANT
TOWEL GREEN STERILE (TOWEL DISPOSABLE) ×6 IMPLANT
TOWEL GREEN STERILE FF (TOWEL DISPOSABLE) ×3 IMPLANT
TUBE CONNECTING 12'X1/4 (SUCTIONS) ×2
TUBE CONNECTING 12X1/4 (SUCTIONS) ×4 IMPLANT
UNDERPAD 30X36 HEAVY ABSORB (UNDERPADS AND DIAPERS) ×3 IMPLANT
WATER STERILE IRR 1000ML POUR (IV SOLUTION) ×3 IMPLANT
YANKAUER SUCT BULB TIP NO VENT (SUCTIONS) ×6 IMPLANT
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
## 2020-09-01 NOTE — Progress Notes (Signed)
Hgb 7.3 yesterday. Labs reviewed with Dr. Maple Quinterrius Errington with anesthesia prior to surgery today. MD ordered type and screen. Order placed at this time for T & S.
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
## 2020-09-01 NOTE — Anesthesia Procedure Notes (Signed)
Procedure Name: Intubation Date/Time: 09/01/2020 2:15 PM Performed by: Lovie Chol, CRNA Pre-anesthesia Checklist: Patient identified, Emergency Drugs available, Suction available and Patient being monitored Patient Re-evaluated:Patient Re-evaluated prior to induction Oxygen Delivery Method: Circle System Utilized Preoxygenation: Pre-oxygenation with 100% oxygen Induction Type: IV induction Ventilation: Mask ventilation without difficulty Laryngoscope Size: Miller and 3 Grade View: Grade I Tube type: Oral Tube size: 7.5 mm Number of attempts: 1 Airway Equipment and Method: Stylet Placement Confirmation: ETT inserted through vocal cords under direct vision,  positive ETCO2 and breath sounds checked- equal and bilateral Secured at: 23 cm Tube secured with: Tape Dental Injury: Teeth and Oropharynx as per pre-operative assessment

## 2020-09-01 NOTE — Transfer of Care (Signed)
Immediate Anesthesia Transfer of Care Note  Patient: Kevin Galvan  Procedure(s) Performed: IRRIGATION AND DEBRIDEMENT HIP (Bilateral Hip)  Patient Location: PACU  Anesthesia Type:General  Level of Consciousness: oriented, drowsy and patient cooperative  Airway & Oxygen Therapy: Patient Spontanous Breathing and Patient connected to nasal cannula oxygen  Post-op Assessment: Report given to RN and Post -op Vital signs reviewed and stable  Post vital signs: Reviewed  Last Vitals:  Vitals Value Taken Time  BP    Temp    Pulse    Resp    SpO2      Last Pain:  Vitals:   09/01/20 1237  TempSrc: Oral  PainSc:       Patients Stated Pain Goal: 0 (08/29/20 2300)  Complications: No complications documented.
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
## 2020-09-01 NOTE — Progress Notes (Signed)
Please see attestation from yesterday.  To I&D and drain placement for his [place] lesion.  I discussed risks and benefits and the possibility of superinfection as well as recollection.  Patient agrees to proceed with surgery and consent was obtained.  Roby Lofts, MD [facility] [phone] (office) [email or web address]

## 2020-09-01 NOTE — Anesthesia Postprocedure Evaluation (Signed)
Anesthesia Post Note  Patient: Kevin Galvan  Procedure(s) Performed: IRRIGATION AND DEBRIDEMENT HIP (Bilateral Hip)     Patient location during evaluation: PACU Anesthesia Type: General Level of consciousness: awake and alert, patient cooperative and oriented Pain management: pain level controlled Vital Signs Assessment: post-procedure vital signs reviewed and stable Respiratory status: spontaneous breathing, nonlabored ventilation and respiratory function stable Cardiovascular status: blood pressure returned to baseline and stable Postop Assessment: no apparent nausea or vomiting Anesthetic complications: no   No complications documented.  Last Vitals:  Vitals:   09/01/20 0828 09/01/20 1237  BP: 124/74 129/85  Pulse:    Resp: 15 12  Temp: 36.7 C 37.1 C  SpO2: 99% 99%    Last Pain:  Vitals:   09/01/20 1237  TempSrc: Oral  PainSc:                  Takahiro Godinho,E. Lowen Mansouri

## 2020-09-02 ENCOUNTER — Encounter (HOSPITAL_COMMUNITY): Payer: Self-pay | Admitting: Student

## 2020-09-02 LAB — CBC
HCT: 22.4 % — ABNORMAL LOW (ref 39.0–52.0)
Hemoglobin: 7.3 g/dL — ABNORMAL LOW (ref 13.0–17.0)
MCH: 27.9 pg (ref 26.0–34.0)
MCHC: 32.6 g/dL (ref 30.0–36.0)
MCV: 85.5 fL (ref 80.0–100.0)
Platelets: 144 10*3/uL — ABNORMAL LOW (ref 150–400)
RBC: 2.62 MIL/uL — ABNORMAL LOW (ref 4.22–5.81)
RDW: 13.8 % (ref 11.5–15.5)
WBC: 10.8 10*3/uL — ABNORMAL HIGH (ref 4.0–10.5)
nRBC: 0.4 % — ABNORMAL HIGH (ref 0.0–0.2)

## 2020-09-02 LAB — BASIC METABOLIC PANEL WITH GFR
Anion gap: 7 (ref 5–15)
BUN: 12 mg/dL (ref 6–20)
CO2: 26 mmol/L (ref 22–32)
Calcium: 9.2 mg/dL (ref 8.9–10.3)
Chloride: 106 mmol/L (ref 98–111)
Creatinine, Ser: 0.87 mg/dL (ref 0.61–1.24)
GFR calc Af Amer: >60 mL/min (ref >60)
GFR calc non Af Amer: >60 mL/min (ref >60)
Glucose, Bld: 127 mg/dL — ABNORMAL HIGH (ref 70–99)
Potassium: 4.6 mmol/L (ref 3.5–5.1)
Sodium: 139 mmol/L (ref 135–145)
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
## 2020-09-02 NOTE — Progress Notes (Addendum)
Day shift RN emptied hemovac during report- only a few drops noted. Output too small to record. Will continue to monitor.  0000: 100 mLs bright red drainage emptied 0230: 90 mLs bright red drainage emptied. More medial drain w/ moderate amount of serous drainage on dressing- changed.  0500: 225 mLs drained
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
## 2020-09-03 LAB — CBC
HCT: 21.5 % — ABNORMAL LOW (ref 39.0–52.0)
HCT: 27.8 % — ABNORMAL LOW (ref 39.0–52.0)
Hemoglobin: 7 g/dL — ABNORMAL LOW (ref 13.0–17.0)
Hemoglobin: 9.1 g/dL — ABNORMAL LOW (ref 13.0–17.0)
MCH: 28.6 pg (ref 26.0–34.0)
MCH: 28.7 pg (ref 26.0–34.0)
MCHC: 32.6 g/dL (ref 30.0–36.0)
MCHC: 32.7 g/dL (ref 30.0–36.0)
MCV: 87.8 fL (ref 80.0–100.0)
MCV: 87.7 fL (ref 80.0–100.0)
Platelets: 181 10*3/uL (ref 150–400)
Platelets: 227 10*3/uL (ref 150–400)
RBC: 2.45 MIL/uL — ABNORMAL LOW (ref 4.22–5.81)
RBC: 3.17 MIL/uL — ABNORMAL LOW (ref 4.22–5.81)
RDW: 14.6 % (ref 11.5–15.5)
RDW: 14.9 % (ref 11.5–15.5)
WBC: 12.9 10*3/uL — ABNORMAL HIGH (ref 4.0–10.5)
WBC: 14.4 10*3/uL — ABNORMAL HIGH (ref 4.0–10.5)
nRBC: 0.9 % — ABNORMAL HIGH (ref 0.0–0.2)
nRBC: 1.4 % — ABNORMAL HIGH (ref 0.0–0.2)

## 2020-09-03 LAB — PREPARE RBC (CROSSMATCH)

## 2020-09-03 MED ORDER — SODIUM CHLORIDE 0.9% IV SOLUTION: Freq: Once | INTRAVENOUS | Status: AC

## 2020-09-03 MED ORDER — MAGNESIUM CITRATE PO SOLN: 0.5000 | Freq: Once | ORAL | Status: DC

## 2020-09-03 NOTE — TOC CAGE-AID Note (Signed)
Transition of Care [facility]) - CAGE-AID Screening   Patient Details  Name: Kevin Galvan MRN: [id number] Date of Birth: October 13, 2001  [facility] CM/SW Contact:    Emeterio Reeve, [place] Phone Number: 09/03/2020, 4:32 PM   Clinical Narrative:  CSW met with pt at bedside. CSW introduced self and explained her role at the hospital.  PT denies alcohol use and substance use. Pt did not need any resources at this time.    CAGE-AID Screening:    Have You Ever Felt You Ought to Cut Down on Your Drinking or Drug Use?: No Have People Annoyed You By Critizing Your Drinking Or Drug Use?: No Have You Felt Bad Or Guilty About Your Drinking Or Drug Use?: No Have You Ever Had a Drink or Used Drugs First Thing In The Morning to Steady Your Nerves or to Get Rid of a Hangover?: No CAGE-AID Score: 0  Substance Abuse Education Offered: Yes       Blima Ledger, [place] Social Worker [phone]

## 2020-09-03 NOTE — Progress Notes (Signed)
PT Cancellation Note  Patient Details Name: Jhoel Stieg MRN: [id number] DOB: 2001/08/17   Cancelled Treatment:    Reason Eval/Treat Not Completed: Medical issues which prohibited therapy. Pt Hgb at 7.0 and receiving a unit of blood. Will check back as time allows.   Kallie Locks, PTA Pager [phone] Acute Rehab   Sheral Apley 09/03/2020, 9:40 AM

## 2020-09-03 NOTE — Progress Notes (Addendum)
OT Cancellation Note  Patient Details Name: Kevin Galvan MRN: [id number] DOB: Oct 21, 2001   Cancelled Treatment:    Reason Eval/Treat Not Completed: Medical issues which prohibited therapy (Per RN, patient with hemoglobin of 7.0. Patient to receive unit of blood at this time. ) OT will continue efforts toward completion of evaluation later this date if time allows.   Kallie Edward OTR/L Supplemental OT, Department of rehab services [phone]  Carrol Bondar R H. 09/03/2020, 8:53 AM
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
## 2020-09-04 LAB — CBC
HCT: 29.2 % — ABNORMAL LOW (ref 39.0–52.0)
Hemoglobin: 9.4 g/dL — ABNORMAL LOW (ref 13.0–17.0)
MCH: 28.4 pg (ref 26.0–34.0)
MCHC: 32.2 g/dL (ref 30.0–36.0)
MCV: 88.2 fL (ref 80.0–100.0)
Platelets: 250 10*3/uL (ref 150–400)
RBC: 3.31 MIL/uL — ABNORMAL LOW (ref 4.22–5.81)
RDW: 15 % (ref 11.5–15.5)
WBC: 14.2 10*3/uL — ABNORMAL HIGH (ref 4.0–10.5)
nRBC: 1.2 % — ABNORMAL HIGH (ref 0.0–0.2)

## 2020-09-04 LAB — TYPE AND SCREEN
ABO/RH(D): A POS
Antibody Screen: NEGATIVE
Unit division: 0

## 2020-09-04 LAB — BPAM RBC
Blood Product Expiration Date: 202110132359
ISSUE DATE / TIME: 202109171209
Unit Type and Rh: 6200

## 2020-09-04 MED ORDER — ENOXAPARIN SODIUM 30 MG/0.3ML ~~LOC~~ SOLN
30.0000 mg | Freq: Two times a day (BID) | SUBCUTANEOUS | Status: DC
  Administered 2020-09-04 – 2020-09-06 (×5): 30 mg via SUBCUTANEOUS
  Filled 2020-09-04 (×5): qty 0.3

## 2020-09-04 MED ORDER — ENOXAPARIN SODIUM 30 MG/0.3ML ~~LOC~~ SOLN: 30.0000 mg | Freq: Two times a day (BID) | SUBCUTANEOUS | Status: DC
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
## 2020-09-05 NOTE — Progress Notes (Signed)
Mobility Specialist: Progress Note   09/05/20 1420  Mobility  Activity Ambulated in hall  Level of Assistance Independent  Assistive Device Front wheel walker  Distance Ambulated (ft) 820 ft  Mobility Response Tolerated well  Mobility performed by Mobility specialist  Bed Position Semi-fowlers  $Mobility charge 1 Mobility   Pre-Mobility: 94 HR, 132/66 BP, 100% SpO2 During Mobility: 131 HR Post-Mobility: 151 HR, 143/70 BP, 100% SpO2  Pt tolerated mobility well. Pt's HR was 151 upon entering room but quickly dropped to 104 after lying back in the bed, RN notified.   [facility] Rhonin Trott Mobility Specialist
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
## 2020-09-06 MED ORDER — BACITRACIN ZINC 500 UNIT/GM EX OINT: TOPICAL_OINTMENT | Freq: Two times a day (BID) | CUTANEOUS | 0 refills | Status: AC

## 2020-09-06 MED ORDER — OXYCODONE HCL 5 MG PO TABS: 5.0000 mg | ORAL_TABLET | Freq: Four times a day (QID) | ORAL | 0 refills | Status: AC | PRN

## 2020-09-06 MED ORDER — ACETAMINOPHEN 500 MG PO TABS: 1000.0000 mg | ORAL_TABLET | Freq: Four times a day (QID) | ORAL | 0 refills | Status: AC

## 2020-09-06 MED ORDER — GABAPENTIN 300 MG PO CAPS: 300.0000 mg | ORAL_CAPSULE | Freq: Three times a day (TID) | ORAL | 0 refills | Status: AC | PRN

## 2020-09-06 MED ORDER — POLYETHYLENE GLYCOL 3350 17 G PO PACK: 17.0000 g | PACK | Freq: Every day | ORAL | 0 refills | Status: AC

## 2020-09-06 MED ORDER — METHOCARBAMOL 500 MG PO TABS: 1000.0000 mg | ORAL_TABLET | Freq: Three times a day (TID) | ORAL | 0 refills | Status: AC | PRN

## 2020-09-06 MED FILL — oxyCODONE HCL 5 MG TABS: 5 | 3 days supply | Qty: 15 | Fill #0

## 2020-09-06 MED FILL — GABAPENTIN 300 MG CAPSULE: 300 | 20 days supply | Qty: 60 | Fill #0

## 2020-09-06 MED FILL — POLYETHYLENE GLYCOL 3350 PO: 17 | 14 days supply | Qty: 238 | Fill #0

## 2020-09-06 MED FILL — SM ANTIBIOTIC 500 UNIT/GM O: 500 | 10 days supply | Qty: 57 | Fill #0

## 2020-09-06 MED FILL — ACETAMINOPHEN 500MG XT STRE: 500 | 3 days supply | Qty: 30 | Fill #0

## 2020-09-06 MED FILL — METHOCARBAMOL 500 MG TABS: 500 | 10 days supply | Qty: 60 | Fill #0
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
## 2020-09-06 NOTE — Progress Notes (Signed)
Pt has been leaking around one of the drain connected to hemovac. Pt's deposable pad had moderate amount of serosanguineous drainage. Underpad changed twice, dressing to right lateral hip to drain entry site changed. Covered with gauze and transparent dressing. Pt tolerated well. Will continue to monitor.
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
## 2020-09-06 NOTE — Discharge Instructions (Signed)
Orthopaedic Trauma Service Discharge Instructions   General Discharge Instructions  WEIGHT BEARING STATUS: Weightbearing as tolerated  Wound Care: Leave drains in place, change dressings as needed over drains. Okay to wash over areas with soap and water  DVT/PE prophylaxis: Lovenox  Diet: as you were eating previously.  Can use over the counter stool softeners and bowel preparations, such as Miralax, to help with bowel movements.  Narcotics can be constipating.  Be sure to drink plenty of fluids  PAIN MEDICATION USE AND EXPECTATIONS  You have likely been given narcotic medications to help control your pain.  After a traumatic event that results in an fracture (broken bone) with or without surgery, it is ok to use narcotic pain medications to help control one's pain.  We understand that everyone responds to pain differently and each individual patient will be evaluated on a regular basis for the continued need for narcotic medications. Ideally, narcotic medication use should last no more than 6-8 weeks (coinciding with fracture healing).   As a patient it is your responsibility as well to monitor narcotic medication use and report the amount and frequency you use these medications when you come to your office visit.   We would also advise that if you are using narcotic medications, you should take a dose prior to therapy to maximize you participation.  IF YOU ARE ON NARCOTIC MEDICATIONS IT IS NOT PERMISSIBLE TO OPERATE A MOTOR VEHICLE (MOTORCYCLE/CAR/TRUCK/MOPED) OR HEAVY MACHINERY DO NOT MIX NARCOTICS WITH OTHER CNS (CENTRAL NERVOUS SYSTEM) DEPRESSANTS SUCH AS ALCOHOL   STOP SMOKING OR USING NICOTINE PRODUCTS!!!!  As discussed nicotine severely impairs your body's ability to heal surgical and traumatic wounds but also impairs bone healing.  Wounds and bone heal by forming microscopic blood vessels (angiogenesis) and nicotine is a vasoconstrictor (essentially, shrinks blood vessels).   Therefore, if vasoconstriction occurs to these microscopic blood vessels they essentially disappear and are unable to deliver necessary nutrients to the healing tissue.  This is one modifiable factor that you can do to dramatically increase your chances of healing your injury.    (This means no smoking, no nicotine gum, patches, etc)  DO NOT USE NONSTEROIDAL ANTI-INFLAMMATORY DRUGS (NSAID'S)  Using products such as Advil (ibuprofen), Aleve (naproxen), Motrin (ibuprofen) for additional pain control during fracture healing can delay and/or prevent the healing response.  If you would like to take over the counter (OTC) medication, Tylenol (acetaminophen) is ok.  However, some narcotic medications that are given for pain control contain acetaminophen as well. Therefore, you should not exceed more than 4000 mg of tylenol in a day if you do not have liver disease.  Also note that there are may OTC medicines, such as cold medicines and allergy medicines that my contain tylenol as well.  If you have any questions about medications and/or interactions please ask your doctor/PA or your pharmacist.      ICE AND ELEVATE INJURED/OPERATIVE EXTREMITY  Using ice and elevating the injured extremity above your heart can help with swelling and pain control.  Icing in a pulsatile fashion, such as 20 minutes on and 20 minutes off, can be followed.    Do not place ice directly on skin. Make sure there is a barrier between to skin and the ice pack.    Using frozen items such as frozen peas works well as the conform nicely to the are that needs to be iced.  USE AN ACE WRAP OR TED HOSE FOR SWELLING CONTROL  In addition  to icing and elevation, Ace wraps or TED hose are used to help limit and resolve swelling.  It is recommended to use Ace wraps or TED hose until you are informed to stop.    When using Ace Wraps start the wrapping distally (farthest away from the body) and wrap proximally (closer to the body)   Example: If you  had surgery on your leg or thing and you do not have a splint on, start the ace wrap at the toes and work your way up to the thigh        If you had surgery on your upper extremity and do not have a splint on, start the ace wrap at your fingers and work your way up to the upper arm    CALL THE OFFICE WITH ANY QUESTIONS OR CONCERNS: [phone]   VISIT OUR WEBSITE FOR ADDITIONAL INFORMATION: [email or web address]     Discharge Wound Care Instructions  Do NOT apply any ointments, solutions or lotions to pin sites or surgical wounds.  These prevent needed drainage and even though solutions like hydrogen peroxide kill bacteria, they also damage cells lining the pin sites that help fight infection.  Applying lotions or ointments can keep the wounds moist and can cause them to breakdown and open up as well. This can increase the risk for infection. When in doubt call the office.  Surgical incisions should be dressed daily.  If any drainage is noted, use one layer of adaptic, then gauze, Kerlix, and an ace wrap.  Once the incision is completely dry and without drainage, it may be left open to air out.  Showering may begin 36-48 hours later.  Cleaning gently with soap and water.  Traumatic wounds should be dressed daily as well.    One layer of adaptic, gauze, Kerlix, then ace wrap.  The adaptic can be discontinued once the draining has ceased    If you have a wet to dry dressing: wet the gauze with saline the squeeze as much saline out so the gauze is moist (not soaking wet), place moistened gauze over wound, then place a dry gauze over the moist one, followed by Kerlix wrap, then ace wrap.

## 2020-09-06 NOTE — Plan of Care (Signed)

## 2020-09-06 NOTE — Progress Notes (Signed)
Pt discharged home with dad. IV and telemetry box removed, per order. Pt received discharge instructions and all questions were answered. Pt demonstrated emptying of hemovac. Pt left with all of his belongings. Pt discharged via wheelchair and was accompanied by an Charity fundraiser.   Tobe Sos RN
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
## 2020-09-06 NOTE — TOC Transition Note (Signed)
Transition of Care (TOC) - CM/SW Discharge Note Donn Pierini RN, BSN Transitions of Care Unit 4E- RN Case Manager See Treatment Team for direct phone #    Patient Details  Name: Keyion Knack MRN: [id number] Date of Birth: 03/04/2001  Transition of Care [facility]) CM/SW Contact:  Darrold Span, RN Phone Number: 09/06/2020, 4:51 PM   Clinical Narrative:    Pt stable for transition home today, orders placed for DME- RW and 3n1- call made to in house provider [employer] for DME needs- TOC to fill meds- pt lives with dad who has provided insurance as Medicaid [employer] [place].  DME- RW and 3n1 to be delivered to room prior to discharge   Final next level of care: Home/Self Care Barriers to Discharge: No Barriers Identified   Patient Goals and CMS Choice Patient states their goals for this hospitalization and ongoing recovery are:: return home      Discharge Placement                 Home      Discharge Plan and Services                DME Arranged: 3-N-1, Walker rolling DME Agency: [facility] Date DME Agency Contacted: 09/06/20 Time DME Agency Contacted: 1430 Representative spoke with at DME Agency: Silvio Pate            Social Determinants of Health (SDOH) Interventions     Readmission Risk Interventions Readmission Risk Prevention Plan 09/06/2020  Post Dischage Appt Complete  Medication Screening Complete  Transportation Screening Complete
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
## 2020-09-22 ENCOUNTER — Ambulatory Visit: Payer: Self-pay

## 2020-09-22 ENCOUNTER — Encounter: Payer: Self-pay | Admitting: Orthopaedic Surgery

## 2020-09-22 ENCOUNTER — Ambulatory Visit (INDEPENDENT_AMBULATORY_CARE_PROVIDER_SITE_OTHER): Payer: Medicaid Other | Admitting: Orthopaedic Surgery

## 2020-09-22 DIAGNOSIS — M25551 Pain in right hip: Secondary | ICD-10-CM | POA: Diagnosis not present

## 2020-09-22 DIAGNOSIS — M545 Low back pain, unspecified: Secondary | ICD-10-CM

## 2020-09-22 DIAGNOSIS — M549 Dorsalgia, unspecified: Secondary | ICD-10-CM
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
